# Patient Record
Sex: Male | Born: 1985 | Race: Black or African American | Hispanic: No | Marital: Single | Smoking: Never smoker
Health system: Southern US, Community
[De-identification: ages and names within clinical notes are randomized; demographics above are authoritative.]

## PROBLEM LIST (undated history)

## (undated) DIAGNOSIS — S86011A Strain of right Achilles tendon, initial encounter: Secondary | ICD-10-CM

---
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
## 2014-06-22 ENCOUNTER — Encounter (HOSPITAL_COMMUNITY): Payer: Self-pay | Admitting: Emergency Medicine

## 2014-06-22 ENCOUNTER — Emergency Department (HOSPITAL_COMMUNITY)
Admission: EM | Admit: 2014-06-22 | Discharge: 2014-06-22 | Disposition: A | Discharge status: Home / Self Care | Payer: BC Managed Care – PPO | Attending: Emergency Medicine | Admitting: Emergency Medicine

## 2014-06-22 DIAGNOSIS — Y9389 Activity, other specified: Secondary | ICD-10-CM | POA: Diagnosis not present

## 2014-06-22 DIAGNOSIS — Y92410 Unspecified street and highway as the place of occurrence of the external cause: Secondary | ICD-10-CM | POA: Insufficient documentation

## 2014-06-22 DIAGNOSIS — Z043 Encounter for examination and observation following other accident: Secondary | ICD-10-CM | POA: Insufficient documentation

## 2014-06-22 MED ORDER — IBUPROFEN 600 MG PO TABS: 600.0000 mg | ORAL_TABLET | Freq: Four times a day (QID) | ORAL | Status: DC | PRN

## 2014-06-22 MED ORDER — CYCLOBENZAPRINE HCL 10 MG PO TABS: 10.0000 mg | ORAL_TABLET | Freq: Two times a day (BID) | ORAL | Status: AC | PRN

## 2014-06-22 NOTE — ED Notes (Signed)
Pt discharged home with all belongings, pt alert, oriented and ambulatory upon discharge. 2 new RX prescribed, pt driven home by family. Pt verbalizes understanding of discharge instructions. Pt refused wheel chair
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
## 2014-06-22 NOTE — Discharge Instructions (Signed)

## 2014-06-23 NOTE — ED Provider Notes (Signed)
Medical screening examination/treatment/procedure(s) were performed by non-physician practitioner and as supervising physician I was immediately available for consultation/collaboration.   EKG Interpretation None        Gilda Creasehristopher J. Pollina, MD 06/23/14 [id number]

## 2014-07-05 ENCOUNTER — Encounter (HOSPITAL_COMMUNITY): Payer: Self-pay | Admitting: Emergency Medicine

## 2014-07-05 ENCOUNTER — Emergency Department (HOSPITAL_COMMUNITY)
Admission: EM | Admit: 2014-07-05 | Discharge: 2014-07-05 | Disposition: A | Discharge status: Home / Self Care | Payer: BC Managed Care – PPO | Source: Home / Self Care | Attending: Family Medicine | Admitting: Family Medicine

## 2014-07-05 DIAGNOSIS — H109 Unspecified conjunctivitis: Secondary | ICD-10-CM

## 2014-07-05 MED ORDER — TOBRAMYCIN 0.3 % OP SOLN: 2.0000 [drp] | OPHTHALMIC | Status: AC

## 2014-07-05 NOTE — Discharge Instructions (Signed)

## 2014-07-05 NOTE — ED Provider Notes (Signed)
CSN: [id number]     Arrival date & time 07/05/14  1055 History   First MD Initiated Contact with Patient 07/05/14 1115     Chief Complaint  Patient presents with  . Conjunctivitis   (Consider location/radiation/quality/duration/timing/severity/associated sxs/prior Treatment) HPI Comments: O/W healthy Is a student No URI sx  Patient is a 28 y.o. male presenting with conjunctivitis. The history is provided by the patient.  Conjunctivitis This is a new problem. The current episode started 2 days ago (sx began in left eye 2 days ago and developed in right eye today). The problem occurs constantly. The problem has been gradually worsening. Associated symptoms comments: +wear disposable contact lenses.    History reviewed. No pertinent past medical history. History reviewed. No pertinent past surgical history. History reviewed. No pertinent family history. History  Substance Use Topics  . Smoking status: Never Smoker   . Smokeless tobacco: Not on file  . Alcohol Use: Yes    Review of Systems  All other systems reviewed and are negative.   Allergies  Review of patient's allergies indicates no known allergies.  Home Medications   Prior to Admission medications   Medication Sig Start Date End Date Taking? Authorizing Provider  cyclobenzaprine (FLEXERIL) 10 MG tablet Take 1 tablet (10 mg total) by mouth 2 (two) times daily as needed for muscle spasms. 06/22/14   Roxy Horsemanobert Browning, PA-C  ibuprofen (ADVIL,MOTRIN) 600 MG tablet Take 1 tablet (600 mg total) by mouth every 6 (six) hours as needed. 06/22/14   Roxy Horsemanobert Browning, PA-C  tobramycin (TOBREX) 0.3 % ophthalmic solution Place 2 drops into both eyes every 4 (four) hours. X 7 days 07/05/14   Jess BartersJennifer Lee H Azyiah Bo, PA   BP 126/74  Pulse 60  Temp(Src) 98.2 F (36.8 C) (Oral)  Resp 14  SpO2 100% Physical Exam  Nursing note and vitals reviewed. Constitutional: He is oriented to person, place, and time. He appears well-developed and  well-nourished. No distress.  HENT:  Head: Normocephalic and atraumatic.  Right Ear: External ear normal.  Left Ear: External ear normal.  Nose: Nose normal.  Mouth/Throat: Oropharynx is clear and moist.  Eyes: EOM and lids are normal. Pupils are equal, round, and reactive to light. Right conjunctiva is injected. Right conjunctiva has no hemorrhage. Left conjunctiva is injected. Left conjunctiva has no hemorrhage. No scleral icterus.  Slit lamp exam:      The right eye shows no corneal abrasion, no corneal ulcer, no foreign body and no fluorescein uptake.       The left eye shows no corneal abrasion, no corneal ulcer, no foreign body and no fluorescein uptake.  Cardiovascular: Normal rate.   Pulmonary/Chest: Effort normal.  Musculoskeletal: Normal range of motion.  Neurological: He is alert and oriented to person, place, and time.  Skin: Skin is warm and dry.  Psychiatric: He has a normal mood and affect. His behavior is normal.    ED Course  Procedures (including critical care time) Labs Review Labs Reviewed - No data to display  Imaging Review No results found.   MDM   1. Bilateral conjunctivitis   No corneal ulceration Tobrex as prescribed Follow up if no improvement No contact lens use until resolved    Ria ClockJennifer Lee H Nahomy Limburg, PA 07/05/14 1155

## 2014-07-05 NOTE — ED Notes (Signed)
C/o bilateral eye redness and irritation.  States redness and crusting started in left eye two days ago and right eye this a.m.  Denies vision problems.

## 2014-07-12 ENCOUNTER — Emergency Department (INDEPENDENT_AMBULATORY_CARE_PROVIDER_SITE_OTHER)
Admission: EM | Admit: 2014-07-12 | Discharge: 2014-07-12 | Disposition: A | Discharge status: Home / Self Care | Payer: BC Managed Care – PPO | Source: Home / Self Care | Attending: Family Medicine | Admitting: Family Medicine

## 2014-07-12 ENCOUNTER — Encounter (HOSPITAL_COMMUNITY): Payer: Self-pay | Admitting: Emergency Medicine

## 2014-07-12 DIAGNOSIS — B309 Viral conjunctivitis, unspecified: Secondary | ICD-10-CM

## 2014-07-12 MED ORDER — OLOPATADINE HCL 0.2 % OP SOLN: 1.0000 [drp] | Freq: Every day | OPHTHALMIC | Status: AC

## 2014-07-12 NOTE — Discharge Instructions (Signed)
You are still fighting a viral eye infection This will take another 1-7 days to clear Please use the pataday for eye redness or buy Zaditor over the counter Please rememember to avoid using contacts until the redness has gone.  If you use contacts please only wear them for as short a time as possible and dispose of them after one use.  Please get rid of your contact case and eye solution Please restart the tobrex if your eyes become pqainful or if you develop purulent eye discharge   Conjunctivitis Conjunctivitis is commonly called "pink eye." Conjunctivitis can be caused by bacterial or viral infection, allergies, or injuries. There is usually redness of the lining of the eye, itching, discomfort, and sometimes discharge. There may be deposits of matter along the eyelids. A viral infection usually causes a watery discharge, while a bacterial infection causes a yellowish, thick discharge. Pink eye is very contagious and spreads by direct contact. You may be given antibiotic eyedrops as part of your treatment. Before using your eye medicine, remove all drainage from the eye by washing gently with warm water and cotton balls. Continue to use the medication until you have awakened 2 mornings in a row without discharge from the eye. Do not rub your eye. This increases the irritation and helps spread infection. Use separate towels from other household members. Wash your hands with soap and water before and after touching your eyes. Use cold compresses to reduce pain and sunglasses to relieve irritation from light. Do not wear contact lenses or wear eye makeup until the infection is gone. SEEK MEDICAL CARE IF:   Your symptoms are not better after 3 days of treatment.  You have increased pain or trouble seeing.  The outer eyelids become very red or swollen. Document Released: 10/01/2004 Document Revised: 11/16/2011 Document Reviewed: 08/24/2005 [facility] Patient Information 2015 [place], [place]. This  information is not intended to replace advice given to you by your health care provider. Make sure you discuss any questions you have with your health care provider.

## 2014-07-12 NOTE — ED Notes (Signed)
Pt here for follow up of bilateral eye redness and drainage.  States using meds as prescribed.  States "I looked up on web MD and tried honey".    No relief states made eyes burn.

## 2014-07-12 NOTE — ED Provider Notes (Addendum)
CSN: [id number]     Arrival date & time 07/12/14  1206 History   None    Chief Complaint  Patient presents with  . Follow-up   (Consider location/radiation/quality/duration/timing/severity/associated sxs/prior Treatment) HPI   Ongoing for 7 days. [facility] and given tobrex. Initially tried the tobrex for 2 days w/o benefit. Pt then tried honey in the eyes BID x 2 day. Improved the redness but very painful. Then started back on Tobrex. Overall eye redness is improving. L better than the R. L started first. Denies eye pain, purulent discharge, fevers, facial pain, rash. No vision change. Pt simply wants approval to wear contacts. Viral URI 14-10 days ago  Reviewed previous clinic note in full.     History reviewed. No pertinent past medical history. History reviewed. No pertinent past surgical history. History reviewed. No pertinent family history. History  Substance Use Topics  . Smoking status: Never Smoker   . Smokeless tobacco: Not on file  . Alcohol Use: Yes    Review of Systems Per HPI with all other pertinent systems negative.   Allergies  Review of patient's allergies indicates no known allergies.  Home Medications   Prior to Admission medications   Medication Sig Start Date End Date Taking? Authorizing Provider  tobramycin (TOBREX) 0.3 % ophthalmic solution Place 2 drops into both eyes every 4 (four) hours. X 7 days 07/05/14  Yes Ria ClockJennifer Lee H Presson, PA  cyclobenzaprine (FLEXERIL) 10 MG tablet Take 1 tablet (10 mg total) by mouth 2 (two) times daily as needed for muscle spasms. 06/22/14   Roxy Horsemanobert Browning, PA-C  ibuprofen (ADVIL,MOTRIN) 600 MG tablet Take 1 tablet (600 mg total) by mouth every 6 (six) hours as needed. 06/22/14   Roxy Horsemanobert Browning, PA-C  Olopatadine HCl 0.2 % SOLN Apply 1 drop to eye daily. 07/12/14   Ozella Rocksavid J Frida Wahlstrom, MD   BP 115/73 mmHg  Pulse 62  Temp(Src) 98.2 F (36.8 C) (Oral)  Resp 16  SpO2 100% Physical Exam   Constitutional: He is oriented to person, place, and time. He appears well-developed and well-nourished. No distress.  HENT:  Head: Normocephalic.  Eyes:  Bilateral conjunctival injection R>L. Clear discharge. EOMI. PERRL. No periorbital puffiness.    Neck: Normal range of motion. Neck supple.  Cardiovascular: Normal rate, normal heart sounds and intact distal pulses.   No murmur heard. Pulmonary/Chest: Effort normal and breath sounds normal.  Musculoskeletal: Normal range of motion. He exhibits no edema or tenderness.  Neurological: He is alert and oriented to person, place, and time. No cranial nerve deficit. Coordination normal.  Skin: Skin is warm. No rash noted. He is not diaphoretic. No erythema.  Psychiatric: He has a normal mood and affect. Judgment normal.    ED Course  Procedures (including critical care time) Labs Review Labs Reviewed - No data to display  Imaging Review No results found.   MDM   1. Viral conjunctivitis    Improving but needs note for school and needed counseling on contact use - wanting to wear contacts for school as he does not have glasses.  Stop tobrex Start pataday or Zaditor  Symptoms of bacterial conjunctivitis discussed Precautions given and all questions answered  Shelly Flattenavid Malayia Spizzirri, MD Family Medicine 07/12/2014, 12:50 PM       Ozella Rocksavid J Micaella Gitto, MD 07/12/14 1251  Ozella Rocksavid J Symia Herdt, MD 07/12/14 1257

## 2015-10-29 ENCOUNTER — Encounter (HOSPITAL_COMMUNITY): Payer: Self-pay | Admitting: Emergency Medicine

## 2015-10-29 ENCOUNTER — Emergency Department (INDEPENDENT_AMBULATORY_CARE_PROVIDER_SITE_OTHER)
Admission: EM | Admit: 2015-10-29 | Discharge: 2015-10-29 | Disposition: A | Discharge status: Home / Self Care | Payer: Self-pay | Source: Home / Self Care | Attending: Family Medicine | Admitting: Family Medicine

## 2015-10-29 DIAGNOSIS — S86112A Strain of other muscle(s) and tendon(s) of posterior muscle group at lower leg level, left leg, initial encounter: Secondary | ICD-10-CM

## 2015-10-29 DIAGNOSIS — S86812A Strain of other muscle(s) and tendon(s) at lower leg level, left leg, initial encounter: Secondary | ICD-10-CM

## 2015-10-29 NOTE — ED Notes (Signed)
Patient reports performing warm-ups yesterday.  Patient was bouncing on balls of feet prior to getting into a stance.  Patient suddenly felt pop and sharp pain in left lower leg.  Patient has been using compression, ice, and rest .  Patient here today because he wants crutches.

## 2015-10-29 NOTE — Discharge Instructions (Signed)
Muscle Strain °A muscle strain (pulled muscle) happens when a muscle is stretched beyond normal length. It happens when a sudden, violent force stretches your muscle too far. Usually, a few of the fibers in your muscle are torn. Muscle strain is common in athletes. Recovery usually takes 1-2 weeks. Complete healing takes 5-6 weeks.  °HOME CARE  °· Follow the PRICE method of treatment to help your injury get better. Do this the first 2-3 days after the injury: °¨ Protect. Protect the muscle to keep it from getting injured again. °¨ Rest. Limit your activity and rest the injured body part. °¨ Ice. Put ice in a plastic bag. Place a towel between your skin and the bag. Then, apply the ice and leave it on from 15-20 minutes each hour. After the third day, switch to moist heat packs. °¨ Compression. Use a splint or elastic bandage on the injured area for comfort. Do not put it on too tightly. °¨ Elevate. Keep the injured body part above the level of your heart. °· Only take medicine as told by your doctor. °· Warm up before doing exercise to prevent future muscle strains. °GET HELP IF:  °· You have more pain or puffiness (swelling) in the injured area. °· You feel numbness, tingling, or notice a loss of strength in the injured area. °MAKE SURE YOU:  °· Understand these instructions. °· Will watch your condition. °· Will get help right away if you are not doing well or get worse. °  °This information is not intended to replace advice given to you by your health care provider. Make sure you discuss any questions you have with your health care provider. °  °Document Released: 06/02/2008 Document Revised: 06/14/2013 Document Reviewed: 03/23/2013 °Elsevier Interactive Patient Education ©2016 [employer] ° °

## 2015-10-29 NOTE — ED Provider Notes (Signed)
CSN: [id number]     Arrival date & time 10/29/15  1303 History   First MD Initiated Contact with Patient 10/29/15 1332     Chief Complaint  Patient presents with  . Leg Pain   (Consider location/radiation/quality/duration/timing/severity/associated sxs/prior Treatment) HPI History obtained from patient:   LOCATION: left gastroc SEVERITY:3 DURATION:yesterday CONTEXT:accelerating during warm ups QUALITY:pain in back of calf MODIFYING FACTORS:[employer] SYMPTOMS:painful to try and run TIMING:now constant OCCUPATION:student athlete  History reviewed. No pertinent past medical history. History reviewed. No pertinent past surgical history. No family history on file. Social History  Substance Use Topics  . Smoking status: Never Smoker   . Smokeless tobacco: None  . Alcohol Use: Yes    Review of Systems ROS +'ve gastroc pain  Denies: HEADACHE, NAUSEA, ABDOMINAL PAIN, CHEST PAIN, CONGESTION, DYSURIA, SHORTNESS OF BREATH  Allergies  Review of patient's allergies indicates no known allergies.  Home Medications   Prior to Admission medications   Medication Sig Start Date End Date Taking? Authorizing Provider  cyclobenzaprine (FLEXERIL) 10 MG tablet Take 1 tablet (10 mg total) by mouth 2 (two) times daily as needed for muscle spasms. 06/22/14   Roxy Horseman, PA-C  ibuprofen (ADVIL,MOTRIN) 600 MG tablet Take 1 tablet (600 mg total) by mouth every 6 (six) hours as needed. 06/22/14   Roxy Horseman, PA-C  Olopatadine HCl 0.2 % SOLN Apply 1 drop to eye daily. 07/12/14   Ozella Rocks, MD  tobramycin (TOBREX) 0.3 % ophthalmic solution Place 2 drops into both eyes every 4 (four) hours. X 7 days 07/05/14   Ria Clock, PA   Meds Ordered and Administered this Visit  Medications - No data to display  BP 124/78 mmHg  Pulse 70  Temp(Src) 98 F (36.7 C) (Oral)  Resp 16  SpO2 97% No data found.   Physical Exam  Constitutional: He is oriented to person, place,  and time. He appears well-developed and well-nourished.  HENT:  Head: Normocephalic and atraumatic.  Musculoskeletal:  Patient has some tenderness noted on along the upper head of the gastrocnemius muscle. It is no Achilles tendon tenderness. Thompson test is negative. Negative Homans sign. No popliteal tenderness.  Neurological: He is alert and oriented to person, place, and time.  Skin: Skin is warm and dry.  Psychiatric: He has a normal mood and affect. His behavior is normal.  Nursing note and vitals reviewed.   ED Course  Procedures (including critical care time)  Labs Review Labs Reviewed - No data to display  Imaging Review No results found.   Visual Acuity Review  Right Eye Distance:   Left Eye Distance:   Bilateral Distance:    Right Eye Near:   Left Eye Near:    Bilateral Near:         MDM   1. Gastrocnemius muscle strain, left, initial encounter    Patient is advised to continue home symptomatic treatment.  Patient is advised that if there are new or worsening symptoms or attend the emergency department, or contact primary care provider. Instructions of care provided discharged home in stable condition. Return to work/school note provided.  THIS NOTE WAS GENERATED USING A VOICE RECOGNITION SOFTWARE PROGRAM. ALL REASONABLE EFFORTS  WERE MADE TO PROOFREAD THIS DOCUMENT FOR ACCURACY.     Tharon Aquas, PA 10/29/15 [phone]

## 2020-10-27 ENCOUNTER — Ambulatory Visit (HOSPITAL_COMMUNITY)
Admission: EM | Admit: 2020-10-27 | Discharge: 2020-10-27 | Disposition: A | Discharge status: Home / Self Care | Payer: PRIVATE HEALTH INSURANCE | Attending: Medical Oncology | Admitting: Medical Oncology

## 2020-10-27 ENCOUNTER — Encounter (HOSPITAL_COMMUNITY): Payer: Self-pay | Admitting: *Deleted

## 2020-10-27 ENCOUNTER — Other Ambulatory Visit: Payer: Self-pay

## 2020-10-27 ENCOUNTER — Ambulatory Visit (INDEPENDENT_AMBULATORY_CARE_PROVIDER_SITE_OTHER): Payer: PRIVATE HEALTH INSURANCE

## 2020-10-27 DIAGNOSIS — S86001A Unspecified injury of right Achilles tendon, initial encounter: Secondary | ICD-10-CM | POA: Diagnosis not present

## 2020-10-27 DIAGNOSIS — M79671 Pain in right foot: Secondary | ICD-10-CM | POA: Diagnosis not present

## 2020-10-27 MED ORDER — IBUPROFEN 800 MG PO TABS: 800.0000 mg | ORAL_TABLET | Freq: Three times a day (TID) | ORAL | 0 refills | Status: AC

## 2020-10-27 MED ORDER — KETOROLAC TROMETHAMINE 30 MG/ML IJ SOLN: 30.0000 mg | Freq: Once | INTRAMUSCULAR | Status: DC

## 2020-10-27 NOTE — Progress Notes (Signed)
Orthopedic Tech Progress Note Patient Details:  David Whitney 1986-02-06 [id number]  Ortho Devices Type of Ortho Device: Post (short leg) splint,Crutches Splint Material: Fiberglass (w/ bulky dressing around achilles/ankle) Ortho Device/Splint Location: Left Lower Extremity Ortho Device/Splint Interventions: Ordered,Application   Post Interventions Patient Tolerated: Well Instructions Provided: Adjustment of device,Care of device,Poper ambulation with device   Gerald Stabs 10/27/2020, 6:48 PM

## 2020-10-27 NOTE — ED Triage Notes (Signed)
Pt reports he was playing football today and injured his Rt achil tendon. Pt unable to bear weight on rt foot.

## 2020-10-27 NOTE — ED Provider Notes (Addendum)
[facility]    CSN: [id number] Arrival date & time: 10/27/20  1523      History   Chief Complaint Chief Complaint  Patient presents with  . Tendonitis    RT     HPI David Whitney is a 35 y.o. male.   HPI   Tendonitis: Pt reports that today he was playing football when he planted his right foot in the ground and turned.  He came straight from this event to our office.  He immediately felt as if the quarterback have thrown the football right into his ankle but quickly realized this had not occurred.  Initially had discomfort but now states that discomfort only occurs when he tries to move his foot.  When he tries to move his foot his calf and posterior ankle hurts.  He is concerned that he ruptured his Achilles tendon.  No previous injuries to this area in the past.  He has not taken or tried anything for pain.  No loss of sensation, edema or erythema.   History reviewed. No pertinent past medical history.  There are no problems to display for this patient.   History reviewed. No pertinent surgical history.     Home Medications    Prior to Admission medications   Medication Sig Start Date End Date Taking? Authorizing Provider  cyclobenzaprine (FLEXERIL) 10 MG tablet Take 1 tablet (10 mg total) by mouth 2 (two) times daily as needed for muscle spasms. 06/22/14   Roxy Horseman, PA-C  ibuprofen (ADVIL,MOTRIN) 600 MG tablet Take 1 tablet (600 mg total) by mouth every 6 (six) hours as needed. 06/22/14   Roxy Horseman, PA-C  Olopatadine HCl 0.2 % SOLN Apply 1 drop to eye daily. 07/12/14   Ozella Rocks, MD  tobramycin (TOBREX) 0.3 % ophthalmic solution Place 2 drops into both eyes every 4 (four) hours. X 7 days 07/05/14   Presson, Mathis Fare, PA    Family History History reviewed. No pertinent family history.  Social History Social History   Tobacco Use  . Smoking status: Never Smoker  . Smokeless tobacco: Never Used  Substance Use Topics  .  Alcohol use: Yes  . Drug use: No     Allergies   Patient has no known allergies.   Review of Systems Review of Systems  As stated above in HPI Physical Exam Triage Vital Signs ED Triage Vitals  Enc Vitals Group     BP 10/27/20 1611 114/80     Pulse Rate 10/27/20 1611 69     Resp 10/27/20 1611 16     Temp 10/27/20 1611 98.6 F (37 C)     Temp Source 10/27/20 1611 Oral     SpO2 10/27/20 1611 98 %     Weight --      Height --      Head Circumference --      Peak Flow --      Pain Score 10/27/20 1608 7     Pain Loc --      Pain Edu? --      Excl. in GC? --    No data found.  Updated Vital Signs BP 114/80 (BP Location: Right Arm)   Pulse 69   Temp 98.6 F (37 C) (Oral)   Resp 16   SpO2 98%   Physical Exam Vitals and nursing note reviewed.  Constitutional:      General: He is not in acute distress.    Appearance: Normal appearance. He  is not ill-appearing, toxic-appearing or diaphoretic.  Musculoskeletal:     Comments: Decreased range of motion of the right ankle. Positive Thompson Test of the right side. Muscle tension and tenderness of the inferior right calf. No peripheral edema or increased warmth.   Skin:    Findings: Bruising (achilles area of the right ) present.  Neurological:     Mental Status: He is alert.      UC Treatments / Results  Labs (all labs ordered are listed, but only abnormal results are displayed) Labs Reviewed - No data to display  EKG   Radiology No results found.  Procedures Procedures (including critical care time)  Medications Ordered in UC Medications - No data to display  Initial Impression / Assessment and Plan / UC Course  I have reviewed the triage vital signs and the nursing notes.  Pertinent labs & imaging results that were available during my care of the patient were reviewed by me and considered in my medical decision making (see chart for details).     New. Bracing for achilles rupture along with  Toradol. Crutches and non -weight bearing status. Discussed red flag signs and symptoms. 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   Final Clinical Impressions(s) / UC Diagnoses   Final diagnoses:  None   Discharge Instructions   None    ED Prescriptions    None     PDMP not reviewed this encounter.   Rushie Chestnut, PA-C 10/27/20 1703    Rushie Chestnut, PA-C 10/27/20 1821

## 2021-08-25 IMAGING — DX DG TIBIA/FIBULA 2V*R*
2 series · 2 of 2 positions shown · non-contrast
Comparison: None.

CLINICAL DATA: Right heel pain.

EXAM:
RIGHT TIBIA AND FIBULA - 2 VIEW

[tibia ap]
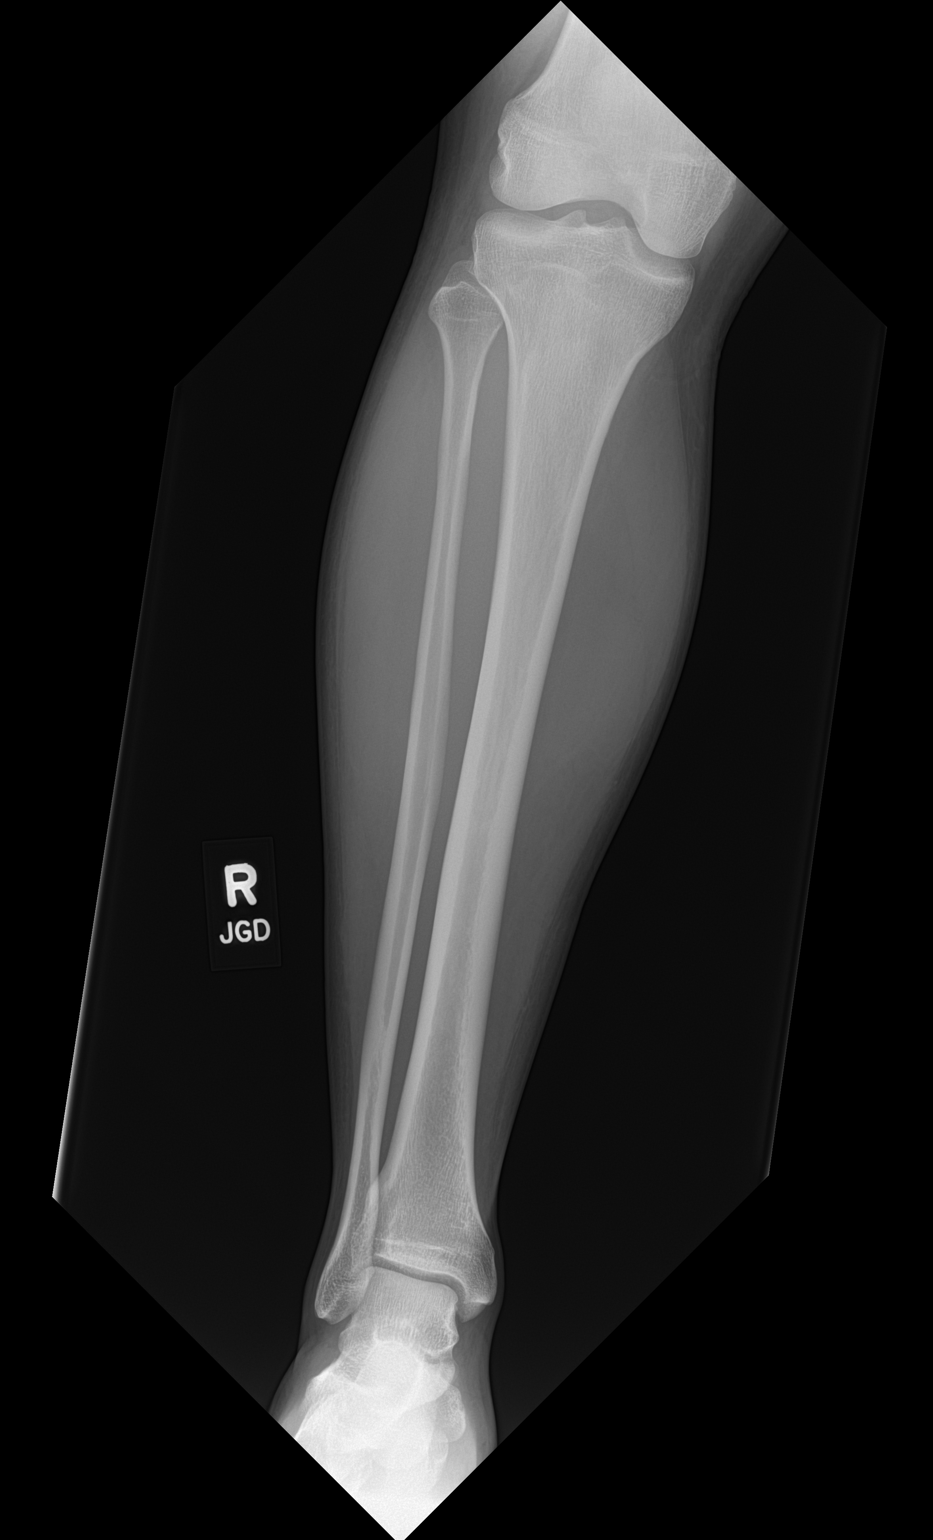

[tibia lat]
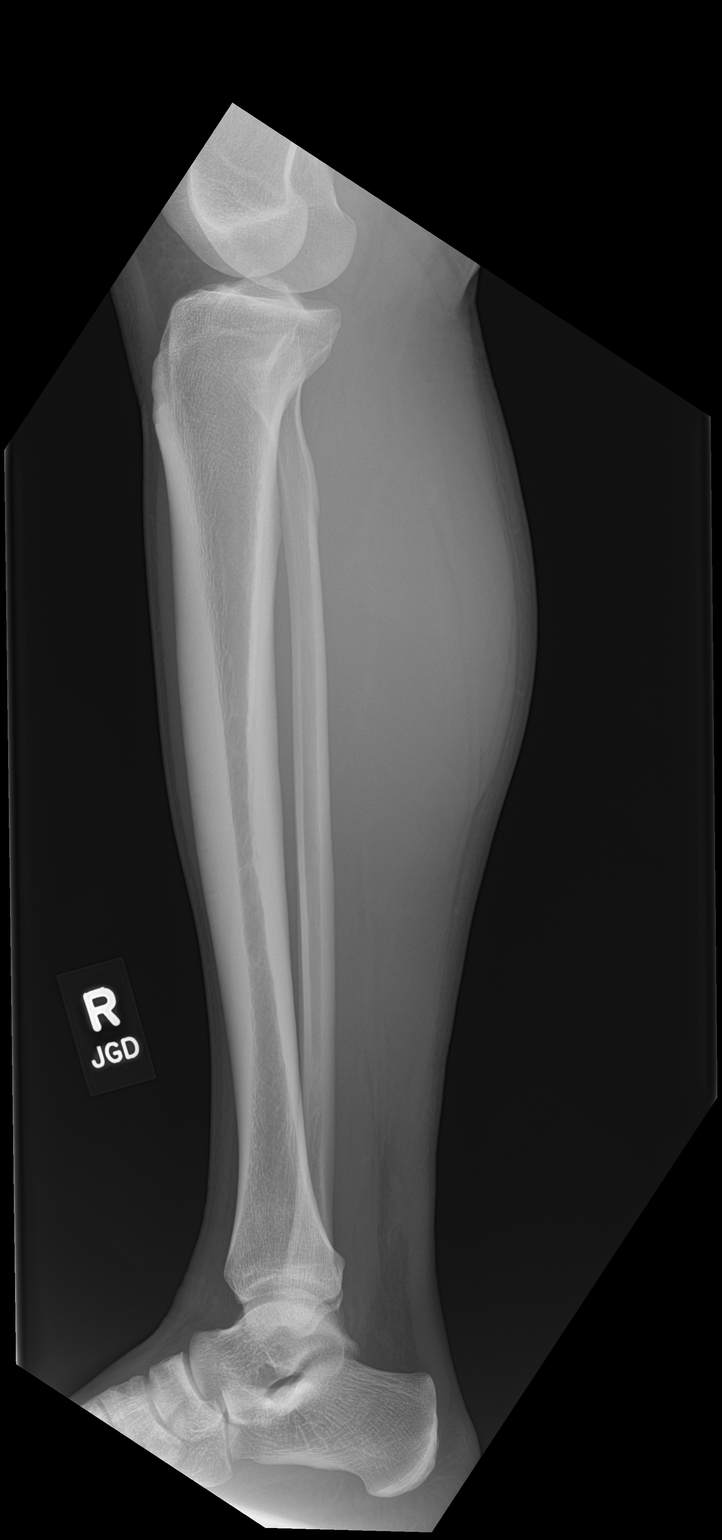

[2 of 2 positions shown; findings below may reference images not displayed]

FINDINGS: There is potential thickening of the mid to proximal Achilles tendon
with some edema within the adjacent fat pad. There is no acute
displaced fracture. No evidence for a dislocation.
IMPRESSION: 1. No acute displaced fracture.
2. Possible thickening of the Achilles tendon with some edema within
the adjacent fat pad. Correlate clinically for signs and symptoms of
an Achilles injury which can be further evaluated by MRI.

## 2023-04-25 ENCOUNTER — Emergency Department (HOSPITAL_BASED_OUTPATIENT_CLINIC_OR_DEPARTMENT_OTHER)
Admission: EM | Admit: 2023-04-25 | Discharge: 2023-04-25 | Disposition: A | Discharge status: Home / Self Care | Payer: BLUE CROSS/BLUE SHIELD | Source: Home / Self Care | Attending: Emergency Medicine | Admitting: Emergency Medicine

## 2023-04-25 ENCOUNTER — Other Ambulatory Visit: Payer: Self-pay

## 2023-04-25 ENCOUNTER — Emergency Department (HOSPITAL_BASED_OUTPATIENT_CLINIC_OR_DEPARTMENT_OTHER): Payer: BLUE CROSS/BLUE SHIELD

## 2023-04-25 ENCOUNTER — Encounter (HOSPITAL_BASED_OUTPATIENT_CLINIC_OR_DEPARTMENT_OTHER): Payer: Self-pay | Admitting: Emergency Medicine

## 2023-04-25 DIAGNOSIS — W2101XA Struck by football, initial encounter: Secondary | ICD-10-CM | POA: Insufficient documentation

## 2023-04-25 DIAGNOSIS — Y9361 Activity, american tackle football: Secondary | ICD-10-CM | POA: Diagnosis not present

## 2023-04-25 DIAGNOSIS — M79605 Pain in left leg: Secondary | ICD-10-CM | POA: Diagnosis present

## 2023-04-25 HISTORY — DX: Strain of right Achilles tendon, initial encounter: S86.011A

## 2023-04-25 MED ORDER — ACETAMINOPHEN 325 MG PO TABS
650.0000 mg | ORAL_TABLET | Freq: Once | ORAL | Status: AC
  Administered 2023-04-25: 650 mg via ORAL
  Filled 2023-04-25: qty 2

## 2023-04-25 NOTE — ED Triage Notes (Signed)
Pt injured left ankle this am while playing football.  Pt concerned it is his achilles tendon.

## 2023-04-25 NOTE — ED Provider Notes (Signed)
[facility] Provider Note   CSN: [id number] Arrival date & time: 04/25/23  1033     History  Chief Complaint  Patient presents with   Ankle Pain    David Whitney is a 37 y.o. male.  Patient with pain in his left calf/ankle after noncontact injury while doing some football drills.  Has torn his right Achilles in the past and feels similar today on the left.  He heard a loud pop.  He is unable to really plantarflex much is able to dorsiflex a little bit.  Nothing makes it worse or better otherwise.  Denies any numbness weakness tingling.  The history is provided by the patient.       Home Medications Prior to Admission medications   Medication Sig Start Date End Date Taking? Authorizing Provider  cyclobenzaprine (FLEXERIL) 10 MG tablet Take 1 tablet (10 mg total) by mouth 2 (two) times daily as needed for muscle spasms. 06/22/14   Roxy Horseman, PA-C  ibuprofen (ADVIL) 800 MG tablet Take 1 tablet (800 mg total) by mouth 3 (three) times daily. 10/27/20   Rushie Chestnut, PA-C  Olopatadine HCl 0.2 % SOLN Apply 1 drop to eye daily. 07/12/14   Ozella Rocks, MD  tobramycin (TOBREX) 0.3 % ophthalmic solution Place 2 drops into both eyes every 4 (four) hours. X 7 days 07/05/14   Ria Clock, PA      Allergies    Patient has no known allergies.    Review of Systems   Review of Systems  Physical Exam Updated Vital Signs BP 111/74 (BP Location: Right Arm)   Pulse 77   Temp 97.8 F (36.6 C) (Oral)   Resp 20   Ht 5\' 6"  (1.676 m)   Wt 83.9 kg   SpO2 97%   BMI 29.86 kg/m  Physical Exam Vitals and nursing note reviewed.  Constitutional:      General: He is not in acute distress.    Appearance: He is well-developed.  HENT:     Head: Normocephalic and atraumatic.  Cardiovascular:     Rate and Rhythm: Normal rate and regular rhythm.     Pulses: Normal pulses.  Pulmonary:     Effort: Pulmonary effort is  normal.  Musculoskeletal:        General: Tenderness present. No swelling.     Cervical back: Neck supple.     Comments: Tenderness to the left ankle, calf, equivocal Thompson's test able to dorsiflex a little bit not much ability to plantarflex  Skin:    General: Skin is warm and dry.     Capillary Refill: Capillary refill takes less than 2 seconds.  Neurological:     Mental Status: He is alert.     Sensory: No sensory deficit.     Motor: No weakness.  Psychiatric:        Mood and Affect: Mood normal.     ED Results / Procedures / Treatments   Labs (all labs ordered are listed, but only abnormal results are displayed) Labs Reviewed - No data to display  EKG None  Radiology DG Ankle Complete Left  Result Date: 04/25/2023 CLINICAL DATA:  Left ankle pain EXAM: LEFT ANKLE COMPLETE - 3+ VIEW COMPARISON:  None Available. FINDINGS: No acute fracture or dislocation. No aggressive osseous lesion. Normal alignment. Mild edema in Kager's fat. No radiopaque foreign body or soft tissue emphysema. IMPRESSION: 1. No acute osseous injury of the left ankle.  2. Mild edema in Kager's fat. If there is clinical concern regarding an Achilles injury, recommend an MRI of the left ankle. Electronically Signed   By: Elige Ko M.D.   On: 04/25/2023 11:30    Procedures Procedures    Medications Ordered in ED Medications  acetaminophen (TYLENOL) tablet 650 mg (650 mg Oral Given 04/25/23 1123)    ED Course/ Medical Decision Making/ A&P                                 Medical Decision Making Amount and/or Complexity of Data Reviewed Radiology: ordered.  Risk OTC drugs.   David Whitney is here with concern for Achilles tendon injury to his left leg.  Normal vitals.  No fever.  Neurovascular neuromuscular intact.  Equivocal Thompson's test but seems to not plantarflex much.  X-ray to be performed.  He has ruptured his right tendon in the past feels similar today.  Noncontact injury while  doing some football drills where he was starting and stopping.  Felt a sudden pop.  Had surgery to his right Achilles tendon in the past.  I suspect he likely has some sort of Achilles tendinitis or tear today.  X-ray does not show any obvious fracture or malalignment of the ankle.  However per radiology report there is concern that there is likely Achilles injury/tear. Will place in a splint and have him be nonweightbearing have him follow-up with his orthopedic physician.    Recommend Tylenol and ibuprofen and rest.  Discharged in good condition.  Understands return precautions.  This chart was dictated using voice recognition software.  Despite best efforts to proofread,  errors can occur which can change the documentation meaning.         Final Clinical Impression(s) / ED Diagnoses Final diagnoses:  Left leg pain    Rx / DC Orders ED Discharge Orders     None         Virgina Norfolk, DO 04/25/23 1136

## 2023-04-25 NOTE — Discharge Instructions (Addendum)
Recommend 600 mg ibuprofen every 8 hours as needed for pain.  Recommend 1000 mg of Tylenol every 6 hours as needed for pain.  Be nonweightbearing to your left lower extremity and call your orthopedic doctor to arrange for follow-up appointment.  I suspect you do have an Achilles tendon tear.  I have requested for your images to be sent over electronically so that your orthopedic provider should be able to see the x-rays from today.

## 2023-04-25 NOTE — ED Notes (Signed)
X RAY at bedside
